# Patient Record
Sex: Female | Born: 1955 | Race: Black or African American | Hispanic: No | Marital: Married | Smoking: Never smoker
Health system: Southern US, Community
[De-identification: ages and names within clinical notes are randomized; demographics above are authoritative.]

## PROBLEM LIST (undated history)

## (undated) DIAGNOSIS — I8390 Asymptomatic varicose veins of unspecified lower extremity: Secondary | ICD-10-CM

## (undated) DIAGNOSIS — I10 Essential (primary) hypertension: Secondary | ICD-10-CM

## (undated) DIAGNOSIS — N63 Unspecified lump in unspecified breast: Secondary | ICD-10-CM

## (undated) HISTORY — DX: Unspecified lump in unspecified breast: N63.0

## (undated) HISTORY — PX: TUBAL LIGATION: SHX77

## (undated) HISTORY — DX: Asymptomatic varicose veins of unspecified lower extremity: I83.90

## (undated) HISTORY — PX: WISDOM TOOTH EXTRACTION: SHX21

## (undated) HISTORY — PX: MOLE REMOVAL: SHX2046

---

## 1997-06-14 ENCOUNTER — Ambulatory Visit (HOSPITAL_COMMUNITY): Admission: RE | Admit: 1997-06-14 | Discharge: 1997-06-14 | Payer: Self-pay | Admitting: Obstetrics and Gynecology

## 1998-09-02 ENCOUNTER — Ambulatory Visit (HOSPITAL_COMMUNITY): Admission: RE | Admit: 1998-09-02 | Discharge: 1998-09-02 | Payer: Self-pay | Admitting: Obstetrics and Gynecology

## 1998-09-02 ENCOUNTER — Encounter: Payer: Self-pay | Admitting: Obstetrics and Gynecology

## 1998-11-01 ENCOUNTER — Encounter (INDEPENDENT_AMBULATORY_CARE_PROVIDER_SITE_OTHER): Payer: Self-pay

## 1998-11-01 ENCOUNTER — Ambulatory Visit (HOSPITAL_COMMUNITY): Admission: RE | Admit: 1998-11-01 | Discharge: 1998-11-01 | Payer: Self-pay | Admitting: Obstetrics and Gynecology

## 1999-08-18 ENCOUNTER — Emergency Department (HOSPITAL_COMMUNITY): Admission: EM | Admit: 1999-08-18 | Discharge: 1999-08-18 | Payer: Self-pay | Admitting: Emergency Medicine

## 2000-02-08 ENCOUNTER — Ambulatory Visit (HOSPITAL_COMMUNITY): Admission: RE | Admit: 2000-02-08 | Discharge: 2000-02-08 | Payer: Self-pay | Admitting: Obstetrics and Gynecology

## 2000-02-08 ENCOUNTER — Encounter: Payer: Self-pay | Admitting: Obstetrics and Gynecology

## 2001-02-21 ENCOUNTER — Encounter: Payer: Self-pay | Admitting: Obstetrics and Gynecology

## 2001-02-21 ENCOUNTER — Ambulatory Visit (HOSPITAL_COMMUNITY): Admission: RE | Admit: 2001-02-21 | Discharge: 2001-02-21 | Payer: Self-pay | Admitting: Obstetrics and Gynecology

## 2002-04-01 ENCOUNTER — Other Ambulatory Visit: Admission: RE | Admit: 2002-04-01 | Discharge: 2002-04-01 | Payer: Self-pay | Admitting: Obstetrics and Gynecology

## 2002-04-09 ENCOUNTER — Encounter: Payer: Self-pay | Admitting: Obstetrics and Gynecology

## 2002-04-09 ENCOUNTER — Ambulatory Visit (HOSPITAL_COMMUNITY): Admission: RE | Admit: 2002-04-09 | Discharge: 2002-04-09 | Payer: Self-pay | Admitting: Obstetrics and Gynecology

## 2002-08-11 ENCOUNTER — Encounter: Payer: Self-pay | Admitting: Internal Medicine

## 2002-08-11 ENCOUNTER — Encounter: Admission: RE | Admit: 2002-08-11 | Discharge: 2002-08-11 | Payer: Self-pay | Admitting: Internal Medicine

## 2003-09-15 ENCOUNTER — Ambulatory Visit (HOSPITAL_COMMUNITY): Admission: RE | Admit: 2003-09-15 | Discharge: 2003-09-15 | Payer: Self-pay | Admitting: Obstetrics and Gynecology

## 2003-11-30 ENCOUNTER — Other Ambulatory Visit: Admission: RE | Admit: 2003-11-30 | Discharge: 2003-11-30 | Payer: Self-pay | Admitting: Family Medicine

## 2005-03-12 ENCOUNTER — Other Ambulatory Visit: Admission: RE | Admit: 2005-03-12 | Discharge: 2005-03-12 | Payer: Self-pay | Admitting: Obstetrics and Gynecology

## 2005-04-11 ENCOUNTER — Ambulatory Visit (HOSPITAL_COMMUNITY): Admission: RE | Admit: 2005-04-11 | Discharge: 2005-04-11 | Payer: Self-pay | Admitting: Obstetrics and Gynecology

## 2006-04-12 ENCOUNTER — Ambulatory Visit (HOSPITAL_COMMUNITY): Admission: RE | Admit: 2006-04-12 | Discharge: 2006-04-12 | Payer: Self-pay | Admitting: Obstetrics and Gynecology

## 2008-02-24 ENCOUNTER — Ambulatory Visit (HOSPITAL_COMMUNITY): Admission: RE | Admit: 2008-02-24 | Discharge: 2008-02-24 | Payer: Self-pay | Admitting: Obstetrics & Gynecology

## 2010-05-12 ENCOUNTER — Other Ambulatory Visit (HOSPITAL_COMMUNITY): Payer: Self-pay | Admitting: Obstetrics and Gynecology

## 2010-05-12 DIAGNOSIS — Z1231 Encounter for screening mammogram for malignant neoplasm of breast: Secondary | ICD-10-CM

## 2010-05-18 ENCOUNTER — Ambulatory Visit (HOSPITAL_COMMUNITY)
Admission: RE | Admit: 2010-05-18 | Discharge: 2010-05-18 | Disposition: A | Discharge status: Home / Self Care | Payer: Managed Care, Other (non HMO) | Source: Ambulatory Visit | Attending: Obstetrics and Gynecology | Admitting: Obstetrics and Gynecology

## 2010-05-18 DIAGNOSIS — Z1231 Encounter for screening mammogram for malignant neoplasm of breast: Secondary | ICD-10-CM

## 2010-12-15 ENCOUNTER — Other Ambulatory Visit: Payer: Self-pay | Admitting: Obstetrics and Gynecology

## 2010-12-15 DIAGNOSIS — N6459 Other signs and symptoms in breast: Secondary | ICD-10-CM

## 2010-12-22 ENCOUNTER — Ambulatory Visit
Admission: RE | Admit: 2010-12-22 | Discharge: 2010-12-22 | Disposition: A | Discharge status: Home / Self Care | Payer: Managed Care, Other (non HMO) | Source: Ambulatory Visit | Attending: Obstetrics and Gynecology | Admitting: Obstetrics and Gynecology

## 2010-12-22 DIAGNOSIS — N6459 Other signs and symptoms in breast: Secondary | ICD-10-CM

## 2011-04-28 ENCOUNTER — Emergency Department (HOSPITAL_COMMUNITY)
Admission: EM | Admit: 2011-04-28 | Discharge: 2011-04-28 | Disposition: A | Discharge status: Home / Self Care | Payer: Managed Care, Other (non HMO) | Source: Home / Self Care | Attending: Emergency Medicine | Admitting: Emergency Medicine

## 2011-04-28 ENCOUNTER — Encounter (HOSPITAL_COMMUNITY): Payer: Self-pay

## 2011-04-28 DIAGNOSIS — H6693 Otitis media, unspecified, bilateral: Secondary | ICD-10-CM

## 2011-04-28 DIAGNOSIS — J069 Acute upper respiratory infection, unspecified: Secondary | ICD-10-CM

## 2011-04-28 DIAGNOSIS — H6120 Impacted cerumen, unspecified ear: Secondary | ICD-10-CM

## 2011-04-28 DIAGNOSIS — H6690 Otitis media, unspecified, unspecified ear: Secondary | ICD-10-CM

## 2011-04-28 DIAGNOSIS — H109 Unspecified conjunctivitis: Secondary | ICD-10-CM

## 2011-04-28 MED ORDER — AMOXICILLIN 500 MG PO CAPS: 1000.0000 mg | ORAL_CAPSULE | Freq: Three times a day (TID) | ORAL | Status: AC

## 2011-04-28 MED ORDER — POLYMYXIN B-TRIMETHOPRIM 10000-0.1 UNIT/ML-% OP SOLN: 1.0000 [drp] | OPHTHALMIC | Status: AC

## 2011-04-28 NOTE — ED Provider Notes (Signed)
Chief Complaint  Patient presents with  . Ear Fullness    History of Present Illness:  The patient has had a two-week history of nasal congestion with bloody drainage, chills, and cough. She has not had a sore throat. Her left ear has felt congested, her eyes have been watery, itchy, irritated, and had some purulent drainage.  Review of Systems:  Other than noted above, the patient denies any of the following symptoms. Systemic:  No fever, chills, sweats, fatigue, myalgias, headache, or anorexia. Eye:  No redness, pain or drainage. ENT:  No earache, nasal congestion, rhinorrhea, sinus pressure, or sore throat. Lungs:  No cough, sputum production, wheezing, shortness of breath. Or chest pain. GI:  No nausea, vomiting, abdominal pain or diarrhea. Skin:  No rash or itching.  PMFSH:  Past medical history, family history, social history, meds, and allergies were reviewed.  Physical Exam:   Vital signs:  BP 137/75  Pulse 60  Temp(Src) 97.5 F (36.4 C) (Oral)  Resp 18  SpO2 100% General:  Alert, in no distress. Eye:  No conjunctival injection or drainage. ENT:  There was cerumen in both ear canals. This was irrigated out and thereafter both TMs appeared pink and dull.  Nasal mucosa was clear and uncongested, without drainage.  Mucous membranes were moist.  Pharynx was clear, without exudate or drainage.  There were no oral ulcerations or lesions. Neck:  Supple, no adenopathy, tenderness or mass. Lungs:  No respiratory distress.  Lungs were clear to auscultation, without wheezes, rales or rhonchi.  Breath sounds were clear and equal bilaterally. Heart:  Regular rhythm, without gallops, murmers or rubs. Skin:  Clear, warm, and dry, without rash or lesions.  Assessment:   Diagnoses that have been ruled out:  None  Diagnoses that are still under consideration:  None  Final diagnoses:  Upper respiratory infection  Bilateral otitis media  Conjunctivitis  Cerumen impaction      Plan:     1.  The following meds were prescribed:   New Prescriptions   AMOXICILLIN (AMOXIL) 500 MG CAPSULE    Take 2 capsules (1,000 mg total) by mouth 3 (three) times daily.   TRIMETHOPRIM-POLYMYXIN B (POLYTRIM) OPHTHALMIC SOLUTION    Place 1 drop into both eyes every 4 (four) hours.   2.  The patient was instructed in symptomatic care and handouts were given. 3.  The patient was told to return if becoming worse in any way, if no better in 3 or 4 days, and given some red flag symptoms that would indicate earlier return.   Roque Lias, MD 04/28/11 [phone]

## 2011-04-28 NOTE — ED Notes (Signed)
Pt states her lt ear is stopped up and having pressure on lt side of face and behind lt eye.

## 2011-05-14 ENCOUNTER — Other Ambulatory Visit: Payer: Self-pay | Admitting: Obstetrics and Gynecology

## 2011-05-14 DIAGNOSIS — N6311 Unspecified lump in the right breast, upper outer quadrant: Secondary | ICD-10-CM

## 2011-05-14 DIAGNOSIS — N6315 Unspecified lump in the right breast, overlapping quadrants: Secondary | ICD-10-CM

## 2011-05-28 ENCOUNTER — Ambulatory Visit
Admission: RE | Admit: 2011-05-28 | Discharge: 2011-05-28 | Disposition: A | Discharge status: Home / Self Care | Payer: Managed Care, Other (non HMO) | Source: Ambulatory Visit | Attending: Obstetrics and Gynecology | Admitting: Obstetrics and Gynecology

## 2011-05-28 ENCOUNTER — Other Ambulatory Visit: Payer: Self-pay | Admitting: Obstetrics and Gynecology

## 2011-05-28 DIAGNOSIS — N6315 Unspecified lump in the right breast, overlapping quadrants: Secondary | ICD-10-CM

## 2011-05-28 DIAGNOSIS — Z780 Asymptomatic menopausal state: Secondary | ICD-10-CM

## 2011-08-15 ENCOUNTER — Other Ambulatory Visit: Payer: Self-pay | Admitting: Obstetrics and Gynecology

## 2011-08-15 DIAGNOSIS — N6489 Other specified disorders of breast: Secondary | ICD-10-CM

## 2011-08-16 ENCOUNTER — Other Ambulatory Visit: Payer: Self-pay | Admitting: Obstetrics and Gynecology

## 2011-08-16 ENCOUNTER — Ambulatory Visit
Admission: RE | Admit: 2011-08-16 | Discharge: 2011-08-16 | Disposition: A | Discharge status: Home / Self Care | Payer: Managed Care, Other (non HMO) | Source: Ambulatory Visit | Attending: Obstetrics and Gynecology | Admitting: Obstetrics and Gynecology

## 2011-08-16 DIAGNOSIS — N6489 Other specified disorders of breast: Secondary | ICD-10-CM

## 2011-09-05 ENCOUNTER — Encounter (INDEPENDENT_AMBULATORY_CARE_PROVIDER_SITE_OTHER): Payer: Self-pay | Admitting: Surgery

## 2011-09-05 ENCOUNTER — Ambulatory Visit (INDEPENDENT_AMBULATORY_CARE_PROVIDER_SITE_OTHER): Payer: Managed Care, Other (non HMO) | Admitting: Surgery

## 2011-09-05 VITALS — BP 132/88 | HR 76 | Temp 97.0°F | Resp 16 | Ht 66.5 in | Wt 222.0 lb

## 2011-09-05 DIAGNOSIS — N63 Unspecified lump in unspecified breast: Secondary | ICD-10-CM

## 2011-09-05 NOTE — Patient Instructions (Signed)
Return if mass becomes bigger.

## 2011-09-05 NOTE — Progress Notes (Signed)
Patient ID: Kimberly Lang, female   DOB: 1956/01/22, 56 y.o.   MRN: [id number]  Chief Complaint  Patient presents with  . Breast Mass    new pt- eval Rt breast lump    HPI Kimberly Lang is a 56 y.o. female.   HPIPatient sent at the request of Dr Lyman Bishop due to a small mass in right breast. This was picked up 9 months ago the patient and his pea-sized. It has not increased in size. It is not tender. She had mammogram done last year in 6 months later which showed no mammographic abnormality.  Past Medical History  Diagnosis Date  . Breast mass     right breast  . Varicose vein     Past Surgical History  Procedure Date  . Tubal ligation   . Wisdom tooth extraction   . Mole removal     benign    Family History  Problem Relation Age of Onset  . Cancer Mother     liver  . Hypertension Mother   . Stroke Brother   . Cancer Paternal Grandmother     stomach    Social History History  Substance Use Topics  . Smoking status: Never Smoker   . Smokeless tobacco: Not on file  . Alcohol Use: No    Allergies  Allergen Reactions  . Codeine     Current Outpatient Prescriptions  Medication Sig Dispense Refill  . b complex vitamins tablet Take 1 tablet by mouth daily.        Review of Systems Review of Systems  Constitutional: Negative for fever, chills and unexpected weight change.  HENT: Negative for hearing loss, congestion, sore throat, trouble swallowing and voice change.   Eyes: Negative for visual disturbance.  Respiratory: Negative for cough and wheezing.   Cardiovascular: Negative for chest pain, palpitations and leg swelling.  Gastrointestinal: Negative for nausea, vomiting, abdominal pain, diarrhea, constipation, blood in stool, abdominal distention and anal bleeding.  Genitourinary: Negative for hematuria, vaginal bleeding and difficulty urinating.  Musculoskeletal: Negative for arthralgias.  Skin: Negative for rash and wound.  Neurological: Negative for  seizures, syncope and headaches.  Hematological: Negative for adenopathy. Does not bruise/bleed easily.  Psychiatric/Behavioral: Negative for confusion.    Blood pressure 132/88, pulse 76, temperature 97 F (36.1 C), temperature source Temporal, resp. rate 16, height 5' 6.5" (1.689 m), weight 222 lb (100.699 kg).  Physical Exam Physical Exam  Constitutional: She appears well-developed and well-nourished.  HENT:  Head: Normocephalic and atraumatic.  Eyes: EOM are normal. Pupils are equal, round, and reactive to light.  Neck: Normal range of motion. Neck supple.  Pulmonary/Chest:       2 mm mobile mass at the 11:30 position 15 cm from the nipple. Otherwise right breast normal. Left breast normal. Both axilla normal.  Skin: Skin is warm and dry.  Psychiatric: She has a normal mood and affect. Her behavior is normal. Judgment and thought content normal.    Data Reviewed Mammograms and ultrasound reviewed. Area corresponds to a small 2 mm cyst. This is unchanged from 6 months ago.  Assessment    Right breast benign cyst stable or 9 months    Plan    No further followup workup needed. Resume yearly screening mammogram. Return to clinic if it gets bigger. Instructions given to patient.       Nami Strawder A. 09/05/2011, 2:40 PM

## 2020-10-11 ENCOUNTER — Emergency Department (HOSPITAL_BASED_OUTPATIENT_CLINIC_OR_DEPARTMENT_OTHER)
Admission: EM | Admit: 2020-10-11 | Discharge: 2020-10-12 | Disposition: A | Discharge status: Home / Self Care | Payer: Medicare Other | Attending: Emergency Medicine | Admitting: Emergency Medicine

## 2020-10-11 ENCOUNTER — Encounter (HOSPITAL_BASED_OUTPATIENT_CLINIC_OR_DEPARTMENT_OTHER): Payer: Self-pay

## 2020-10-11 ENCOUNTER — Other Ambulatory Visit: Payer: Self-pay

## 2020-10-11 DIAGNOSIS — I498 Other specified cardiac arrhythmias: Secondary | ICD-10-CM | POA: Insufficient documentation

## 2020-10-11 DIAGNOSIS — E876 Hypokalemia: Secondary | ICD-10-CM | POA: Diagnosis not present

## 2020-10-11 NOTE — ED Triage Notes (Signed)
Seen today at [facility] and got a call that K was low.

## 2020-10-12 DIAGNOSIS — E876 Hypokalemia: Secondary | ICD-10-CM | POA: Diagnosis not present

## 2020-10-12 LAB — COMPREHENSIVE METABOLIC PANEL
ALT: 24 U/L (ref 0–44)
AST: 29 U/L (ref 15–41)
Albumin: 3.7 g/dL (ref 3.5–5.0)
Alkaline Phosphatase: 57 U/L (ref 38–126)
Anion gap: 13 (ref 5–15)
BUN: 25 mg/dL — ABNORMAL HIGH (ref 8–23)
CO2: 27 mmol/L (ref 22–32)
Calcium: 10.5 mg/dL — ABNORMAL HIGH (ref 8.9–10.3)
Chloride: 95 mmol/L — ABNORMAL LOW (ref 98–111)
Creatinine, Ser: 1.65 mg/dL — ABNORMAL HIGH (ref 0.44–1.00)
GFR, Estimated: 34 mL/min — ABNORMAL LOW (ref >60)
Glucose, Bld: 133 mg/dL — ABNORMAL HIGH (ref 70–99)
Potassium: 3.5 mmol/L (ref 3.5–5.1)
Sodium: 135 mmol/L (ref 135–145)
Total Bilirubin: 0.7 mg/dL (ref 0.3–1.2)
Total Protein: 7.9 g/dL (ref 6.5–8.1)

## 2020-10-12 LAB — POTASSIUM: Potassium: 3.1 mmol/L — ABNORMAL LOW (ref 3.5–5.1)

## 2020-10-12 MED ORDER — POTASSIUM CHLORIDE CRYS ER 20 MEQ PO TBCR
40.0000 meq | EXTENDED_RELEASE_TABLET | Freq: Once | ORAL | Status: AC
  Administered 2020-10-12: 40 meq via ORAL
  Filled 2020-10-12: qty 2

## 2020-10-12 NOTE — ED Provider Notes (Signed)
[facility] Provider Note: Kimberly Dell, MD, FACEP  CSN: [id number] MRN: [id number] ARRIVAL: 10/11/20 at 2333 ROOM: MH12/MH12   CHIEF COMPLAINT  Abnormal Lab   HISTORY OF PRESENT ILLNESS  10/12/20 12:41 AM Kimberly Lang is a 65 y.o. female who was seen at her physician's office yesterday and routine lab work was done.  She was called and told that her potassium was low.  A review of those labs revealed that her potassium was 2.8.  On repeat in this ED her potassium is 3.5.  She has not taken any potassium supplementation since yesterday.  She is asymptomatic.  She is not having palpitations although her EKG shows atrial bigeminy.  She has no knowledge of that occurring in the past and we have no old EKGs.   Past Medical History:  Diagnosis Date   Breast mass    right breast   Varicose vein     Past Surgical History:  Procedure Laterality Date   MOLE REMOVAL     benign   TUBAL LIGATION     WISDOM TOOTH EXTRACTION      Family History  Problem Relation Age of Onset   Cancer Mother        liver   Hypertension Mother    Stroke Brother    Cancer Paternal Grandmother        stomach    Social History   Tobacco Use   Smoking status: Never  Substance Use Topics   Alcohol use: No   Drug use: No    Prior to Admission medications   Medication Sig Start Date End Date Taking? Authorizing Provider  b complex vitamins tablet Take 1 tablet by mouth daily.    [provider]    Allergies Tramadol and Codeine   REVIEW OF SYSTEMS  Negative except as noted here or in the History of Present Illness.   PHYSICAL EXAMINATION  Initial Vital Signs Blood pressure 135/85, pulse 70, temperature 99.1 F (37.3 C), temperature source Oral, resp. rate 18, height 5' 6.5" (1.689 m), weight 88 kg, SpO2 99 %.  Examination General: Well-developed, well-nourished female in no acute distress; appearance consistent with age of record HENT: normocephalic;  atraumatic Eyes: pupils equal, round and reactive to light; extraocular muscles intact Neck: supple Heart: Atrial bigeminy Lungs: clear to auscultation bilaterally Abdomen: soft; nondistended; nontender; bowel sounds present Extremities: No deformity; full range of motion; pulses normal Neurologic: Awake, alert and oriented; motor function intact in all extremities and symmetric; no facial droop Skin: Warm and dry Psychiatric: Normal mood and affect   RESULTS  Summary of this visit's results, reviewed and interpreted by myself:   EKG Interpretation  Date/Time:  Tuesday October 11 2020 23:58:59 EDT Ventricular Rate:  67 PR Interval:  147 QRS Duration: 92 QT Interval:  414 QTC Calculation: 382 R Axis:   13 Text Interpretation: Duplicate Delete Confirmed by Paula Libra ([id number]) on 10/12/2020 12:42:23 AM        Laboratory Studies: Results for orders placed or performed during the hospital encounter of 10/11/20 (from the past 24 hour(s))  Comprehensive metabolic panel     Status: Abnormal   Collection Time: 10/11/20 11:45 PM  Result Value Ref Range   Sodium 135 135 - 145 mmol/L   Potassium 3.5 3.5 - 5.1 mmol/L   Chloride 95 (L) 98 - 111 mmol/L   CO2 27 22 - 32 mmol/L   Glucose, Bld 133 (H) 70 - 99 mg/dL   BUN 25 (H)  8 - 23 mg/dL   Creatinine, Ser 0.35 (H) 0.44 - 1.00 mg/dL   Calcium 00.9 (H) 8.9 - 10.3 mg/dL   Total Protein 7.9 6.5 - 8.1 g/dL   Albumin 3.7 3.5 - 5.0 g/dL   AST 29 15 - 41 U/L   ALT 24 0 - 44 U/L   Alkaline Phosphatase 57 38 - 126 U/L   Total Bilirubin 0.7 0.3 - 1.2 mg/dL   GFR, Estimated 34 (L) >60 mL/min   Anion gap 13 5 - 15  Potassium     Status: Abnormal   Collection Time: 10/12/20  1:03 AM  Result Value Ref Range   Potassium 3.1 (L) 3.5 - 5.1 mmol/L   Imaging Studies: No results found.  ED COURSE and MDM  Nursing notes, initial and subsequent vitals signs, including pulse oximetry, reviewed and interpreted by myself.  Vitals:   10/11/20 2340  10/11/20 2341 10/12/20 0030 10/12/20 0130  BP: 135/85  106/66 110/67  Pulse: 70  62 (!) 56  Resp: 18  (!) 22 20  Temp: 99.1 F (37.3 C)     TempSrc: Oral     SpO2: 99%  95% 96%  Weight:  88 kg    Height:  5' 6.5" (1.689 m)     Medications  potassium chloride SA (KLOR-CON) CR tablet 40 mEq (has no administration in time range)    The 2 potassium value was checked here at [facility] and neither is comparable to the potassium level obtained yesterday.  It is unclear if this is due to hemolysis of her first sample.  We will give the patient 40 mEq of potassium orally and discharge her home.  She was advised to call her PCP, Dr. Cephus Richer, tomorrow and inform the office of her rechecked potassium values.  We will also refer to cardiology for her atrial bigeminy, which is asymptomatic.  PROCEDURES  Procedures   ED DIAGNOSES     ICD-10-CM   1. Hypokalemia  E87.6     2. Atrial bigeminy  I49.8          Orazio Weller, Jonny Ruiz, MD 10/12/20 [phone]

## 2020-10-12 NOTE — ED Notes (Signed)
EDP at bedside  

## 2020-11-24 ENCOUNTER — Other Ambulatory Visit: Payer: Self-pay

## 2020-11-24 ENCOUNTER — Emergency Department (HOSPITAL_BASED_OUTPATIENT_CLINIC_OR_DEPARTMENT_OTHER): Payer: Medicare Other

## 2020-11-24 ENCOUNTER — Emergency Department (HOSPITAL_BASED_OUTPATIENT_CLINIC_OR_DEPARTMENT_OTHER)
Admission: EM | Admit: 2020-11-24 | Discharge: 2020-11-24 | Disposition: A | Discharge status: Home / Self Care | Payer: Medicare Other | Attending: Emergency Medicine | Admitting: Emergency Medicine

## 2020-11-24 ENCOUNTER — Encounter (HOSPITAL_BASED_OUTPATIENT_CLINIC_OR_DEPARTMENT_OTHER): Payer: Self-pay | Admitting: *Deleted

## 2020-11-24 DIAGNOSIS — R059 Cough, unspecified: Secondary | ICD-10-CM | POA: Diagnosis present

## 2020-11-24 DIAGNOSIS — N189 Chronic kidney disease, unspecified: Secondary | ICD-10-CM | POA: Diagnosis not present

## 2020-11-24 DIAGNOSIS — J189 Pneumonia, unspecified organism: Secondary | ICD-10-CM | POA: Insufficient documentation

## 2020-11-24 DIAGNOSIS — Z20822 Contact with and (suspected) exposure to covid-19: Secondary | ICD-10-CM | POA: Insufficient documentation

## 2020-11-24 DIAGNOSIS — N390 Urinary tract infection, site not specified: Secondary | ICD-10-CM

## 2020-11-24 LAB — COMPREHENSIVE METABOLIC PANEL
ALT: 38 U/L (ref 0–44)
AST: 28 U/L (ref 15–41)
Albumin: 3.2 g/dL — ABNORMAL LOW (ref 3.5–5.0)
Alkaline Phosphatase: 57 U/L (ref 38–126)
Anion gap: 12 (ref 5–15)
BUN: 22 mg/dL (ref 8–23)
CO2: 21 mmol/L — ABNORMAL LOW (ref 22–32)
Calcium: 10.8 mg/dL — ABNORMAL HIGH (ref 8.9–10.3)
Chloride: 104 mmol/L (ref 98–111)
Creatinine, Ser: 1.52 mg/dL — ABNORMAL HIGH (ref 0.44–1.00)
GFR, Estimated: 38 mL/min — ABNORMAL LOW (ref >60)
Glucose, Bld: 110 mg/dL — ABNORMAL HIGH (ref 70–99)
Potassium: 3.8 mmol/L (ref 3.5–5.1)
Sodium: 137 mmol/L (ref 135–145)
Total Bilirubin: 0.8 mg/dL (ref 0.3–1.2)
Total Protein: 7.5 g/dL (ref 6.5–8.1)

## 2020-11-24 LAB — CBC WITH DIFFERENTIAL/PLATELET
Abs Immature Granulocytes: 0.07 10*3/uL (ref 0.00–0.07)
Basophils Absolute: 0.1 10*3/uL (ref 0.0–0.1)
Basophils Relative: 0 %
Eosinophils Absolute: 0.1 10*3/uL (ref 0.0–0.5)
Eosinophils Relative: 1 %
HCT: 31.2 % — ABNORMAL LOW (ref 36.0–46.0)
Hemoglobin: 10.6 g/dL — ABNORMAL LOW (ref 12.0–15.0)
Immature Granulocytes: 1 %
Lymphocytes Relative: 9 %
Lymphs Abs: 1.1 10*3/uL (ref 0.7–4.0)
MCH: 29.5 pg (ref 26.0–34.0)
MCHC: 34 g/dL (ref 30.0–36.0)
MCV: 86.9 fL (ref 80.0–100.0)
Monocytes Absolute: 1.1 10*3/uL — ABNORMAL HIGH (ref 0.1–1.0)
Monocytes Relative: 9 %
Neutro Abs: 9.5 10*3/uL — ABNORMAL HIGH (ref 1.7–7.7)
Neutrophils Relative %: 80 %
Platelets: 347 10*3/uL (ref 150–400)
RBC: 3.59 MIL/uL — ABNORMAL LOW (ref 3.87–5.11)
RDW: 13.7 % (ref 11.5–15.5)
WBC: 12 10*3/uL — ABNORMAL HIGH (ref 4.0–10.5)
nRBC: 0 % (ref 0.0–0.2)

## 2020-11-24 LAB — RESP PANEL BY RT-PCR (FLU A&B, COVID) ARPGX2
Influenza A by PCR: NEGATIVE
Influenza B by PCR: NEGATIVE
SARS Coronavirus 2 by RT PCR: NEGATIVE

## 2020-11-24 LAB — URINALYSIS, ROUTINE W REFLEX MICROSCOPIC
Bilirubin Urine: NEGATIVE
Glucose, UA: NEGATIVE mg/dL
Hgb urine dipstick: NEGATIVE
Ketones, ur: NEGATIVE mg/dL
Nitrite: NEGATIVE
Protein, ur: 30 mg/dL — AB
Specific Gravity, Urine: 1.015 (ref 1.005–1.030)
pH: 5 (ref 5.0–8.0)

## 2020-11-24 LAB — URINALYSIS, MICROSCOPIC (REFLEX)

## 2020-11-24 LAB — LACTIC ACID, PLASMA: Lactic Acid, Venous: 0.8 mmol/L (ref 0.5–1.9)

## 2020-11-24 MED ORDER — CEPHALEXIN 500 MG PO CAPS: 500.0000 mg | ORAL_CAPSULE | Freq: Three times a day (TID) | ORAL | 0 refills | Status: AC

## 2020-11-24 MED ORDER — SODIUM CHLORIDE 0.9 % IV SOLN
1.0000 g | Freq: Once | INTRAVENOUS | Status: AC
  Administered 2020-11-24: 1 g via INTRAVENOUS
  Filled 2020-11-24: qty 10

## 2020-11-24 MED ORDER — SODIUM CHLORIDE 0.9 % IV BOLUS
1000.0000 mL | Freq: Once | INTRAVENOUS | Status: AC
  Administered 2020-11-24: 1000 mL via INTRAVENOUS

## 2020-11-24 MED ORDER — AZITHROMYCIN 250 MG PO TABS: ORAL_TABLET | ORAL | 0 refills | Status: AC

## 2020-11-24 MED ORDER — ACETAMINOPHEN 325 MG PO TABS
650.0000 mg | ORAL_TABLET | Freq: Once | ORAL | Status: AC
  Administered 2020-11-24: 650 mg via ORAL
  Filled 2020-11-24: qty 2

## 2020-11-24 MED ORDER — SODIUM CHLORIDE 0.9 % IV SOLN
500.0000 mg | Freq: Once | INTRAVENOUS | Status: AC
  Administered 2020-11-24: 500 mg via INTRAVENOUS
  Filled 2020-11-24: qty 500

## 2020-11-24 NOTE — ED Triage Notes (Signed)
C/o fever , chills , cough , congestion , fatigue , body aches x 2 weeks.

## 2020-11-24 NOTE — Discharge Instructions (Addendum)
Take keflex and azithromycin for pneumonia or urinary tract infection  Take Tylenol or Motrin for fever  See your doctor for follow-up this week  Return to ER if you have worse cough or fever or vomiting.

## 2020-11-24 NOTE — ED Provider Notes (Signed)
[facility] EMERGENCY DEPARTMENT Provider Note   CSN: [id number] Arrival date & time: 11/24/20  1724     History Chief Complaint  Patient presents with   Fever    Kimberly Lang is a 65 y.o. female hx of CKD, here presenting with fever.  Patient has fevers and chills and cough and flank pain for the last 2 weeks.  Patient states that she initially thought that the [facility] was turned down but she realized she had chills.  For the last 2 days, she states that she has nonproductive cough.  Denies any exposure to COVID.  She was concerned that she may have pneumonia  The history is provided by the patient.      Past Medical History:  Diagnosis Date   Breast mass    right breast   Varicose vein     There are no problems to display for this patient.   Past Surgical History:  Procedure Laterality Date   MOLE REMOVAL     benign   TUBAL LIGATION     WISDOM TOOTH EXTRACTION       OB History   No obstetric history on file.     Family History  Problem Relation Age of Onset   Cancer Mother        liver   Hypertension Mother    Stroke Brother    Cancer Paternal Grandmother        stomach    Social History   Tobacco Use   Smoking status: Never  Substance Use Topics   Alcohol use: No   Drug use: No    Home Medications Prior to Admission medications   Medication Sig Start Date End Date Taking? Authorizing Provider  b complex vitamins tablet Take 1 tablet by mouth daily.    [provider]    Allergies    Tramadol and Codeine  Review of Systems   Review of Systems  Constitutional:  Positive for fever.  Respiratory:  Positive for cough.   All other systems reviewed and are negative.  Physical Exam Updated Vital Signs BP 120/75   Pulse 71   Temp 98.9 F (37.2 C) (Oral)   Resp (!) 28   Ht 5' 6.5" (1.689 m)   Wt 87.1 kg   SpO2 99%   BMI 30.53 kg/m   Physical Exam Vitals and nursing note reviewed.  Constitutional:      Appearance:  Normal appearance.  HENT:     Head: Normocephalic.     Nose: Nose normal.     Mouth/Throat:     Mouth: Mucous membranes are moist.  Eyes:     Extraocular Movements: Extraocular movements intact.     Pupils: Pupils are equal, round, and reactive to light.  Cardiovascular:     Rate and Rhythm: Normal rate and regular rhythm.     Pulses: Normal pulses.     Heart sounds: Normal heart sounds.  Pulmonary:     Comments: Crackles bilateral bases  Abdominal:     General: Abdomen is flat.     Palpations: Abdomen is soft.     Comments: No CVAT   Musculoskeletal:        General: Normal range of motion.     Cervical back: Normal range of motion and neck supple.  Skin:    General: Skin is warm.     Capillary Refill: Capillary refill takes less than 2 seconds.  Neurological:     General: No focal deficit present.  Mental Status: She is alert and oriented to person, place, and time.  Psychiatric:        Mood and Affect: Mood normal.        Behavior: Behavior normal.    ED Results / Procedures / Treatments   Labs (all labs ordered are listed, but only abnormal results are displayed) Labs Reviewed  URINALYSIS, ROUTINE W REFLEX MICROSCOPIC - Abnormal; Notable for the following components:      Result Value   Protein, ur 30 (*)    Leukocytes,Ua SMALL (*)    All other components within normal limits  URINALYSIS, MICROSCOPIC (REFLEX) - Abnormal; Notable for the following components:   Bacteria, UA FEW (*)    All other components within normal limits  CBC WITH DIFFERENTIAL/PLATELET - Abnormal; Notable for the following components:   WBC 12.0 (*)    RBC 3.59 (*)    Hemoglobin 10.6 (*)    HCT 31.2 (*)    Neutro Abs 9.5 (*)    Monocytes Absolute 1.1 (*)    All other components within normal limits  COMPREHENSIVE METABOLIC PANEL - Abnormal; Notable for the following components:   CO2 21 (*)    Glucose, Bld 110 (*)    Creatinine, Ser 1.52 (*)    Calcium 10.8 (*)    Albumin 3.2 (*)     GFR, Estimated 38 (*)    All other components within normal limits  RESP PANEL BY RT-PCR (FLU A&B, COVID) [id number]  CULTURE, BLOOD (ROUTINE X 2)  CULTURE, BLOOD (ROUTINE X 2)  LACTIC ACID, PLASMA    EKG None  Radiology DG Chest 2 View  Result Date: 11/24/2020 CLINICAL DATA:  Cough, fever, chills EXAM: CHEST - 2 VIEW COMPARISON:  None. FINDINGS: Bibasilar airspace opacities, right greater than left. Heart is normal size. No effusions or acute bony abnormality. IMPRESSION: Bibasilar airspace opacities, right greater than left, atelectasis versus infiltrates. Electronically Signed   By: Charlett Nose M.D.   On: 11/24/2020 18:23    Procedures Procedures   Medications Ordered in ED Medications  acetaminophen (TYLENOL) tablet 650 mg (650 mg Oral Given 11/24/20 1746)  sodium chloride 0.9 % bolus 1,000 mL ( Intravenous Stopped 11/24/20 2120)  cefTRIAXone (ROCEPHIN) 1 g in sodium chloride 0.9 % 100 mL IVPB (0 g Intravenous Stopped 11/24/20 2048)  azithromycin (ZITHROMAX) 500 mg in sodium chloride 0.9 % 250 mL IVPB (0 mg Intravenous Stopped 11/24/20 2203)  sodium chloride 0.9 % bolus 1,000 mL ( Intravenous Stopped 11/24/20 2120)    ED Course  I have reviewed the triage vital signs and the nursing notes.  Pertinent labs & imaging results that were available during my care of the patient were reviewed by me and considered in my medical decision making (see chart for details).    MDM Rules/Calculators/A&P                           Kimberly Lang is a 65 y.o. female here with fever, cough, flank pain. Patient is febrile in the.  Vitals otherwise stable.  No CVA tenderness or abdominal tenderness.  She does have crackles on bilateral lung bases.  We will get CBC and CMP and chest x-ray and urinalysis.  10:42 PM Chest x-ray showed bilateral pneumonia.  UA showed possible UTI.  Her creatinine is 1.5 which is baseline.  Patient's white blood cell count is 12.  Her lactate is normal.  Given Rocephin  and azithromycin to cover pneumonia  and UTI.  At this point, I do not think she is septic and is stable for discharge with outpatient antibiotics   Final Clinical Impression(s) / ED Diagnoses Final diagnoses:  None    Rx / DC Orders ED Discharge Orders     None        Charlynne Pander, MD 11/24/20 2243

## 2020-11-29 LAB — CULTURE, BLOOD (ROUTINE X 2)
Culture: NO GROWTH
Culture: NO GROWTH
Special Requests: ADEQUATE
Special Requests: ADEQUATE

## 2022-01-21 IMAGING — DX DG CHEST 2V
2 series · 2 of 2 positions shown · non-contrast
Comparison: None.

CLINICAL DATA: Cough, fever, chills

EXAM:
CHEST - 2 VIEW

[chest pa]
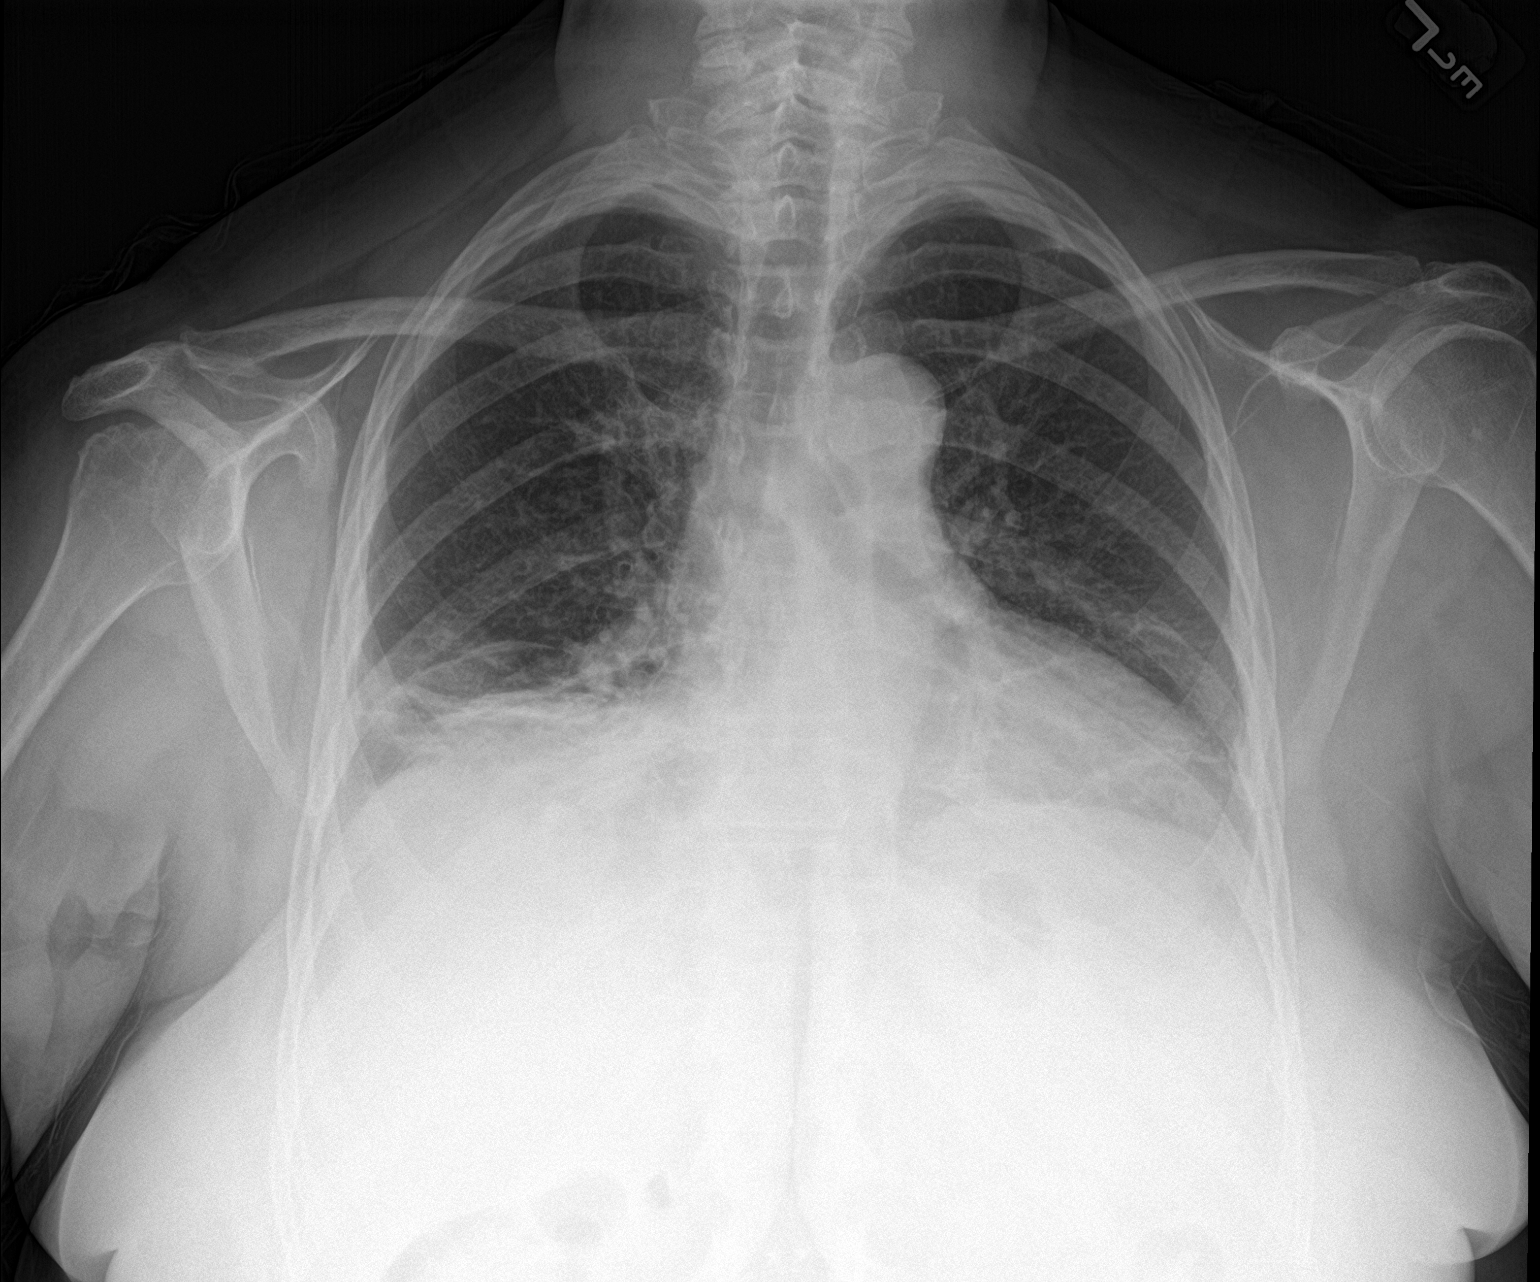

[chest lat]
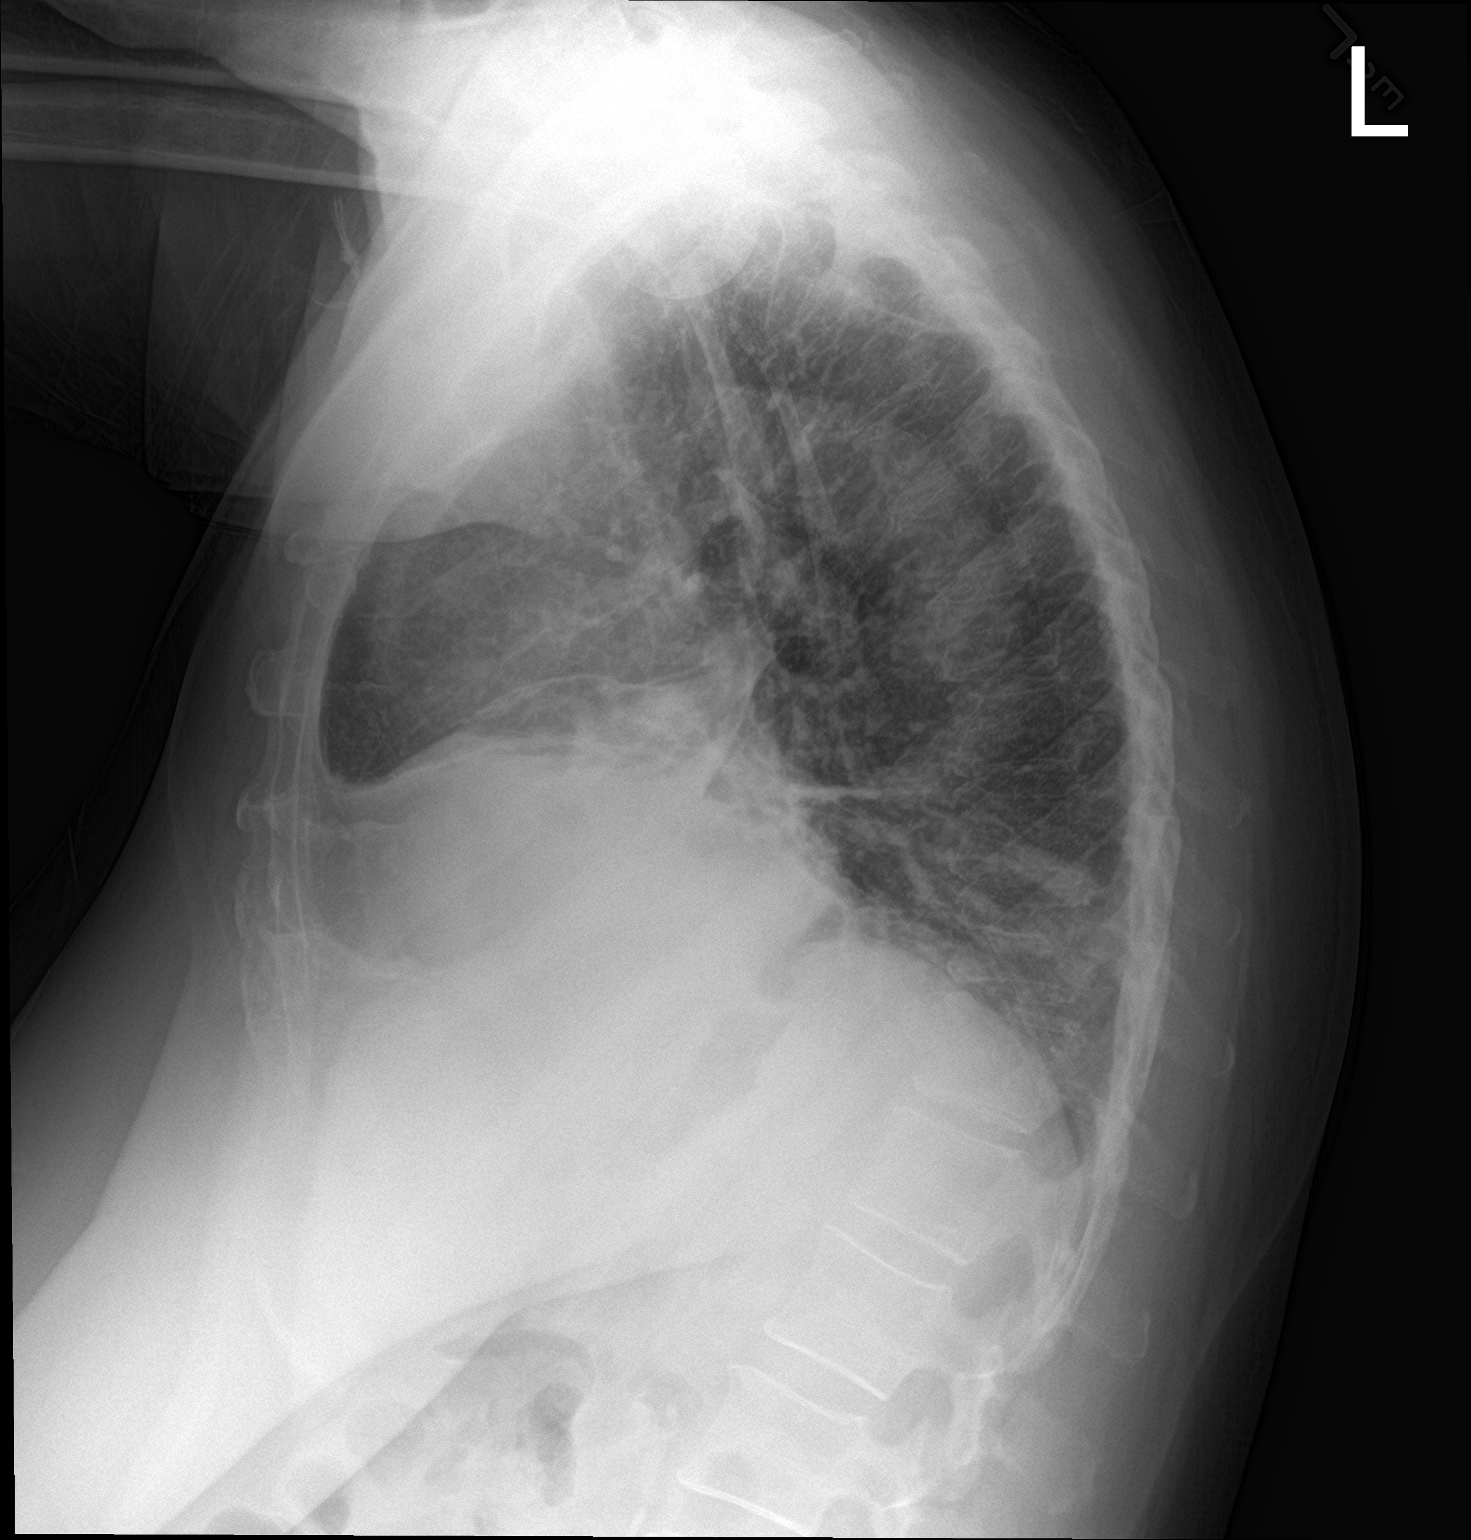

[2 of 2 positions shown; findings below may reference images not displayed]

FINDINGS: Bibasilar airspace opacities, right greater than left. Heart is
normal size. No effusions or acute bony abnormality.
IMPRESSION: Bibasilar airspace opacities, right greater than left, atelectasis
versus infiltrates.

## 2022-12-16 ENCOUNTER — Encounter (HOSPITAL_BASED_OUTPATIENT_CLINIC_OR_DEPARTMENT_OTHER): Payer: Self-pay | Admitting: Emergency Medicine

## 2022-12-16 ENCOUNTER — Emergency Department (HOSPITAL_BASED_OUTPATIENT_CLINIC_OR_DEPARTMENT_OTHER): Payer: Medicare Other

## 2022-12-16 ENCOUNTER — Other Ambulatory Visit: Payer: Self-pay

## 2022-12-16 ENCOUNTER — Emergency Department (HOSPITAL_BASED_OUTPATIENT_CLINIC_OR_DEPARTMENT_OTHER)
Admission: EM | Admit: 2022-12-16 | Discharge: 2022-12-16 | Disposition: A | Discharge status: Home / Self Care | Payer: Medicare Other | Attending: Emergency Medicine | Admitting: Emergency Medicine

## 2022-12-16 DIAGNOSIS — Z20822 Contact with and (suspected) exposure to covid-19: Secondary | ICD-10-CM | POA: Insufficient documentation

## 2022-12-16 DIAGNOSIS — Z720 Tobacco use: Secondary | ICD-10-CM | POA: Insufficient documentation

## 2022-12-16 DIAGNOSIS — F419 Anxiety disorder, unspecified: Secondary | ICD-10-CM | POA: Insufficient documentation

## 2022-12-16 DIAGNOSIS — R5381 Other malaise: Secondary | ICD-10-CM | POA: Diagnosis not present

## 2022-12-16 DIAGNOSIS — Z79899 Other long term (current) drug therapy: Secondary | ICD-10-CM | POA: Insufficient documentation

## 2022-12-16 DIAGNOSIS — R5383 Other fatigue: Secondary | ICD-10-CM | POA: Diagnosis present

## 2022-12-16 HISTORY — DX: Essential (primary) hypertension: I10

## 2022-12-16 LAB — BASIC METABOLIC PANEL
Anion gap: 10 (ref 5–15)
BUN: 39 mg/dL — ABNORMAL HIGH (ref 8–23)
CO2: 23 mmol/L (ref 22–32)
Calcium: 10.1 mg/dL (ref 8.9–10.3)
Chloride: 106 mmol/L (ref 98–111)
Creatinine, Ser: 1.73 mg/dL — ABNORMAL HIGH (ref 0.44–1.00)
GFR, Estimated: 32 mL/min — ABNORMAL LOW (ref >60)
Glucose, Bld: 97 mg/dL (ref 70–99)
Potassium: 4.2 mmol/L (ref 3.5–5.1)
Sodium: 139 mmol/L (ref 135–145)

## 2022-12-16 LAB — CBC
HCT: 42.1 % (ref 36.0–46.0)
Hemoglobin: 14.4 g/dL (ref 12.0–15.0)
MCH: 30.6 pg (ref 26.0–34.0)
MCHC: 34.2 g/dL (ref 30.0–36.0)
MCV: 89.6 fL (ref 80.0–100.0)
Platelets: 219 10*3/uL (ref 150–400)
RBC: 4.7 MIL/uL (ref 3.87–5.11)
RDW: 12.4 % (ref 11.5–15.5)
WBC: 5.5 10*3/uL (ref 4.0–10.5)
nRBC: 0 % (ref 0.0–0.2)

## 2022-12-16 LAB — TROPONIN I (HIGH SENSITIVITY): Troponin I (High Sensitivity): 8 ng/L (ref <18)

## 2022-12-16 LAB — SARS CORONAVIRUS 2 BY RT PCR: SARS Coronavirus 2 by RT PCR: NEGATIVE

## 2022-12-16 MED ORDER — MUPIROCIN 2 % EX OINT: TOPICAL_OINTMENT | Freq: Two times a day (BID) | CUTANEOUS | Status: DC

## 2022-12-16 NOTE — ED Notes (Signed)
Patient transported to X-ray 

## 2022-12-16 NOTE — ED Triage Notes (Signed)
Headache, chills, fatigue, congestion, sore throat since Wednesday. Generally doesn't feel well. Symptoms worsened today. Also reports tightness in left arm and chest tightness. Denies chest pain.  Reports elevated BP at primary care on Wednesday. Elevated more than normal. On amlodipine for htn.

## 2022-12-16 NOTE — ED Notes (Signed)
Reviewed AVS with patient, patient expressed understanding of directions, denies further questions at this time. 

## 2022-12-16 NOTE — ED Notes (Signed)
Staff @bedside  for EKG and labs.

## 2022-12-16 NOTE — ED Provider Notes (Signed)
[name] Provider Note   CSN: [id number] Arrival date & time: 12/16/22  2101     History  Chief Complaint  Patient presents with   Fatigue    Kimberly Lang is a 67 y.o. female.Who presents emergency department with vague symptoms.  Chiefly the patient complains that she "just did not feel right."  She noted that her left arm felt tight earlier tonight.  She is also had some increased fatigue and malaise.  Patient states that recently her blood pressure was elevated.  She is only on 2.5 mg of amlodipine.  Patient reports that her doctor told her recently that they thought she might need to go up on her medication but she states "I do not think I need to do that yet."  She has been on the same dose for quite some time.  She also reports that she knew something was not right because she ate and still felt hungry.  She states she was going to go to bed but then she thought " I should not go to bed if I do not know what is going on."  HPI     Home Medications Prior to Admission medications   Medication Sig Start Date End Date Taking? Authorizing Provider  amLODipine (NORVASC) 2.5 MG tablet Take 1 tablet by mouth daily. 02/14/22  Yes [provider]  azithromycin (ZITHROMAX Z-PAK) 250 MG tablet Take 500 mg daily x 1 day then 250 mg daily x 4 days 11/24/20   Charlynne Pander, MD  b complex vitamins tablet Take 1 tablet by mouth daily.    [provider]  cephALEXin (KEFLEX) 500 MG capsule Take 1 capsule (500 mg total) by mouth 3 (three) times daily. 11/24/20   Charlynne Pander, MD      Allergies    Tramadol, Codeine, Other, and Tape    Review of Systems   Review of Systems  Physical Exam Updated Vital Signs BP (!) 143/82   Pulse (!) 53   Temp 97.9 F (36.6 C)   Resp 20   Ht 5' 6.5" (1.689 m)   Wt 100.2 kg   SpO2 99%   BMI 35.14 kg/m  Physical Exam Vitals and nursing note reviewed.  Constitutional:       General: She is not in acute distress.    Appearance: She is well-developed. She is not diaphoretic.  HENT:     Head: Normocephalic and atraumatic.     Right Ear: External ear normal.     Left Ear: External ear normal.     Nose: Nose normal.     Mouth/Throat:     Mouth: Mucous membranes are moist.  Eyes:     General: No scleral icterus.    Conjunctiva/sclera: Conjunctivae normal.  Cardiovascular:     Rate and Rhythm: Normal rate and regular rhythm.     Heart sounds: Normal heart sounds. No murmur heard.    No friction rub. No gallop.  Pulmonary:     Effort: Pulmonary effort is normal. No respiratory distress.     Breath sounds: Normal breath sounds.  Abdominal:     General: Bowel sounds are normal. There is no distension.     Palpations: Abdomen is soft. There is no mass.     Tenderness: There is no abdominal tenderness. There is no guarding.  Musculoskeletal:     Cervical back: Normal range of motion.  Skin:    General: Skin is warm and dry.  Neurological:     Mental Status: She is alert and oriented to person, place, and time.  Psychiatric:        Mood and Affect: Mood is anxious.        Behavior: Behavior normal.     ED Results / Procedures / Treatments   Labs (all labs ordered are listed, but only abnormal results are displayed) Labs Reviewed  BASIC METABOLIC PANEL - Abnormal; Notable for the following components:      Result Value   BUN 39 (*)    Creatinine, Ser 1.73 (*)    GFR, Estimated 32 (*)    All other components within normal limits  SARS CORONAVIRUS 2 BY RT PCR  CBC  TROPONIN I (HIGH SENSITIVITY)    EKG EKG Interpretation Date/Time:  Sunday December 16 2022 21:55:45 EDT Ventricular Rate:  61 PR Interval:  147 QRS Duration:  86 QT Interval:  445 QTC Calculation: 449 R Axis:   25  Text Interpretation: Sinus arrhythmia RSR' in V1 or V2, right VCD or RVH Confirmed by Alvester Chou [phone]) on 12/16/2022 10:00:41 PM  Radiology DG Chest 2  View  Result Date: 12/16/2022 CLINICAL DATA:  Right arm tightness EXAM: CHEST - 2 VIEW COMPARISON:  11/01/2022 FINDINGS: The heart size and mediastinal contours are within normal limits. Both lungs are clear. The visualized skeletal structures are unremarkable. IMPRESSION: No active cardiopulmonary disease. Electronically Signed   By: Deatra Robinson M.D.   On: 12/16/2022 23:07    Procedures Procedures    Medications Ordered in ED Medications - No data to display  ED Course/ Medical Decision Making/ A&P Clinical Course as of 12/16/22 2339  [facility] Dec 16, 2022  2231 Basic metabolic panel(!) [AH]  2231 CBC [AH]    Clinical Course User Index [AH] Arthor Captain, PA-C                                 Medical Decision Making Amount and/or Complexity of Data Reviewed Labs: ordered. Decision-making details documented in ED Course. Radiology: ordered.   This patient presents to the ED for concern of malaise, this involves an extensive number of treatment options, and is a complaint that carries with it a high risk of complications and morbidity.  The differential diagnosis of weakness includes but is not limited to neurologic causes (GBS, myasthenia gravis, CVA, MS, ALS, transverse myelitis, spinal cord injury, CVA, botulism, ) and other causes: ACS, Arrhythmia, syncope, orthostatic hypotension, sepsis, hypoglycemia, electrolyte disturbance, hypothyroidism, respiratory failure, symptomatic anemia, dehydration, heat injury, polypharmacy, malignancy.   Co morbidities:  has a past medical history of Breast mass, Hypertension, and Varicose vein.    Social Determinants of Health:  SDOH Screenings   Tobacco Use: Low Risk  (12/16/2022)     Additional history:  {Additional history obtained from husband at bedside {External records from outside source obtained and reviewed including outpatient nephrology notes  Lab Tests:  I Ordered, and personally interpreted labs.  The pertinent results  include:   Labs which show baseline renal insufficiency unchanged.  Negative troponin, negative COVID  Imaging Studies:  I ordered imaging studies including two-view chest x-ray I independently visualized and interpreted imaging which showed no acute findings I agree with the radiologist interpretation  Cardiac Monitoring/ECG:  The patient was maintained on a cardiac monitor.  I personally viewed and interpreted the cardiac monitored which showed an underlying rhythm of:  Normal sinus rhythm, EKG without  signs of ischemia    Test Considered:   I considered a second troponin however patient's symptoms are highly inconsistent with acute MI.  Given length of time patient has been feeling vaguely fatigued but also suspect her to have an elevated troponin.  She has no chest pain shortness of breath. Patient also has no evidence of DVT in the left upper extremity.  Critical Interventions:    Consultations Obtained:   Problem List / ED Course:     ICD-10-CM   1. Malaise  R53.81       MDM: Patient here with vague symptoms.  I suspect she was mostly worried about not feeling well and her blood pressure being elevated.  I have advised the patient that she is on an extremely low dose of pressure medication and could certainly afford to go up significantly on her dose if she is running high.  She does not appear to have any evidence of hypertensive urgency/emergency.  Will discharged with close PCP follow-up   Dispostion:  After consideration of the diagnostic results and the patients response to treatment, I feel that the patent would benefit from discharge.         Final Clinical Impression(s) / ED Diagnoses Final diagnoses:  Malaise    Rx / DC Orders ED Discharge Orders     None         Arthor Captain, PA-C 12/16/22 2341    Terald Sleeper, MD 12/19/22 [phone]

## 2022-12-16 NOTE — Discharge Instructions (Signed)
Get help right away if: You feel confused, feel like you might faint, or faint. Your vision is blurry or you have a severe headache. You have severe pain in your abdomen, your back, or the area between your waist and hips (pelvis). You have chest pain, shortness of breath, or an irregular or fast heartbeat. You are unable to urinate, or you urinate less than normal. You have abnormal bleeding from the rectum, nose, lungs, nipples, or, if you are female, the vagina. You vomit blood. You have thoughts about hurting yourself or others. These symptoms may be an emergency. Get help right away. Call 911.

## 2023-11-25 ENCOUNTER — Ambulatory Visit (HOSPITAL_COMMUNITY)
Admission: RE | Admit: 2023-11-25 | Discharge: 2023-11-25 | Disposition: A | Discharge status: Home / Self Care | Source: Ambulatory Visit | Attending: Surgery | Admitting: Surgery

## 2023-11-25 ENCOUNTER — Other Ambulatory Visit (HOSPITAL_COMMUNITY): Payer: Self-pay | Admitting: Physician Assistant

## 2023-11-25 DIAGNOSIS — R609 Edema, unspecified: Secondary | ICD-10-CM | POA: Insufficient documentation
